# Patient Record
Sex: Male | Born: 1985 | Race: Black or African American | Hispanic: No | Marital: Single | Smoking: Never smoker
Health system: Southern US, Community
[De-identification: ages and names within clinical notes are randomized; demographics above are authoritative.]

---

## 2016-09-13 ENCOUNTER — Ambulatory Visit
Admission: EM | Admit: 2016-09-13 | Discharge: 2016-09-13 | Disposition: A | Discharge status: Home / Self Care | Payer: Self-pay | Attending: Family Medicine | Admitting: Family Medicine

## 2016-09-13 ENCOUNTER — Encounter: Payer: Self-pay | Admitting: *Deleted

## 2016-09-13 ENCOUNTER — Ambulatory Visit (INDEPENDENT_AMBULATORY_CARE_PROVIDER_SITE_OTHER): Payer: Self-pay

## 2016-09-13 DIAGNOSIS — R600 Localized edema: Secondary | ICD-10-CM

## 2016-09-13 DIAGNOSIS — J069 Acute upper respiratory infection, unspecified: Secondary | ICD-10-CM

## 2016-09-13 MED ORDER — HYDROCOD POLST-CPM POLST ER 10-8 MG/5ML PO SUER: 5.0000 mL | Freq: Two times a day (BID) | ORAL | 0 refills | Status: AC

## 2016-09-13 MED ORDER — AZITHROMYCIN 250 MG PO TABS: ORAL_TABLET | ORAL | 0 refills | Status: AC

## 2016-09-13 MED ORDER — BENZONATATE 200 MG PO CAPS: 200.0000 mg | ORAL_CAPSULE | Freq: Three times a day (TID) | ORAL | 0 refills | Status: AC

## 2016-09-13 NOTE — ED Triage Notes (Signed)
Patient has had symptoms of cough, chest congestion, and headache for 3 weeks that will not resolve with OTC medications. Bilateral foot swelling started 10 days ago. Possible mechanism of injury unknown.

## 2016-09-13 NOTE — Discharge Instructions (Signed)
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 Be careful of the amount of salt you eat in food.

## 2016-09-13 NOTE — ED Provider Notes (Signed)
CSN: [id number]     Arrival date & time 09/13/16  1354 History   First MD Initiated Contact with Patient 09/13/16 1624     Chief Complaint  Patient presents with  . Cough  . 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 Been taking over-the-counter medicines without success. He states that his cough is not always productive but he also mentions that his been having night sweats. Is having to change his   night shirt t twice each night. He does have chills but has not had any measurable fever although his temperature today is 99. Also concerned because of foot swelling that he has noticed . He does sit for prolonged periods while driving his truck. Never seeing the foot swelling before. It does not involve his legs only his feet mostly seen around the ankles.He is originally from [place]       History reviewed. No pertinent past medical history. History reviewed. No pertinent surgical history. History reviewed. No pertinent family history. Social History  Substance Use Topics  . Smoking status: Never Smoker  . Smokeless tobacco: Never Used  . Alcohol use No    Review of Systems  Constitutional: Positive for activity change, chills and fatigue. Negative for fever.  HENT: Positive for congestion.   Respiratory: Positive for cough. Negative for shortness of breath, wheezing and stridor.   All other systems reviewed and are negative.   Allergies  Patient has no known allergies.  Home Medications   Prior to Admission medications   Medication Sig Start Date End Date Taking? Authorizing Provider  azithromycin (ZITHROMAX Z-PAK) 250 MG tablet Use as per package instructions 09/13/16   Lutricia Feil, PA-C  benzonatate (TESSALON) 200 MG capsule Take 1 capsule (200 mg total) by mouth every 8  (eight) hours. 09/13/16   Lutricia Feil, PA-C  chlorpheniramine-HYDROcodone (TUSSIONEX PENNKINETIC ER) 10-8 MG/5ML SUER Take 5 mLs by mouth 2 (two) times daily. 09/13/16   Lutricia Feil, PA-C   Meds Ordered and Administered this Visit  Medications - No data to display  BP 136/87 (BP Location: Left Arm)   Pulse 87   Temp 99 F (37.2 C) (Oral)   Resp 18   Ht 5' 9.29" (1.76 m)   Wt 154 lb 5.2 oz (70 kg)   SpO2 98%   BMI 22.60 kg/m  No data found.   Physical Exam  Constitutional: He is oriented to person, place, and time. He appears well-developed and well-nourished. No distress.  HENT:  Head: Normocephalic and atraumatic.  Right Ear: External ear normal.  Left Ear: External ear normal.  Nose: Nose normal.  Mouth/Throat: Oropharynx is clear and moist. No oropharyngeal exudate.  Eyes: Pupils are equal, round, and reactive to light. Right eye exhibits no discharge. Left eye exhibits no discharge.  Neck: Normal range of motion. Neck supple.  Pulmonary/Chest: Effort normal.  Patient has fine non-tussive crackles in the right upper lobe.  Musculoskeletal: Normal range of motion.  Neurological: He is alert and oriented to person, place, and time.  Skin: Skin is warm and dry. He is not diaphoretic.  Psychiatric: He has a normal mood and affect. His behavior is normal. Judgment and thought content normal.  Nursing note and vitals reviewed.   Urgent Care Course     Procedures (including critical care time)  Labs Review Labs Reviewed -  No data to display  Imaging Review Dg Chest 2 View  Result Date: 09/13/2016 CLINICAL DATA:  Cough, chest congestion, and headache for 3 weeks unresponsive to over the counter medication. Onset of bilateral lower extremity edema 10 days ago. EXAM: CHEST  2 VIEW COMPARISON:  None in PACs FINDINGS: The lungs are well-expanded and clear. The heart and pulmonary vascularity are normal. The mediastinum is normal in width. There is no pleural effusion.  The trachea is midline. The bony thorax exhibits no acute abnormality. IMPRESSION: There is no pneumonia, CHF, nor other acute cardiopulmonary abnormality. Electronically Signed   By: David  [place] M.D.   On: 09/13/2016 16:59     Visual Acuity Review  Right Eye Distance:   Left Eye Distance:   Bilateral Distance:    Right Eye Near:   Left Eye Near:    Bilateral Near:         MDM   1. Upper respiratory tract infection, unspecified type   2. Pedal edema    Discharge Medication List as of 09/13/2016  5:29 PM    START taking these medications   Details  azithromycin (ZITHROMAX Z-PAK) 250 MG tablet Use as per package instructions, Normal    benzonatate (TESSALON) 200 MG capsule Take 1 capsule (200 mg total) by mouth every 8 (eight) hours., Starting Tue 09/13/2016, Normal    chlorpheniramine-HYDROcodone (TUSSIONEX PENNKINETIC ER) 10-8 MG/5ML SUER Take 5 mLs by mouth 2 (two) times daily., Starting Tue 09/13/2016, Print      Plan: 1. Test/x-ray results and diagnosis reviewed with patient 2. rx as per orders; risks, benefits, potential side effects reviewed with patient 3. Recommend supportive treatment with rest and fluids for the cough. Had this for over 3 weeks I will start him on an antibiotic although the chest x-ray did not show any cardiopulmonary disease at the present time. I have provided him with a Tessalon pearls for use while driving and Tussionex only when he is not driving and have given him this warning. He should not use the Tussionex with activities requiring concentration and judgment and certainly not  driving. For his pedal edema which I believe is dependent he should use compression hose driving and elevate his feet every night above the level of his heart. 4. F/u prn if symptoms worsen or don't improve     Lutricia Feil, PA-C 09/13/16 1735    Lutricia Feil, PA-C 09/13/16 [phone]

## 2018-02-10 IMAGING — CR DG CHEST 2V
2 series · 2 of 2 positions shown · non-contrast
Comparison: None in PACs

CLINICAL DATA: Cough, chest congestion, and headache for 3 weeks
unresponsive to over the counter medication. Onset of bilateral
lower extremity edema 10 days ago.

EXAM:
CHEST  2 VIEW

[chest pa]
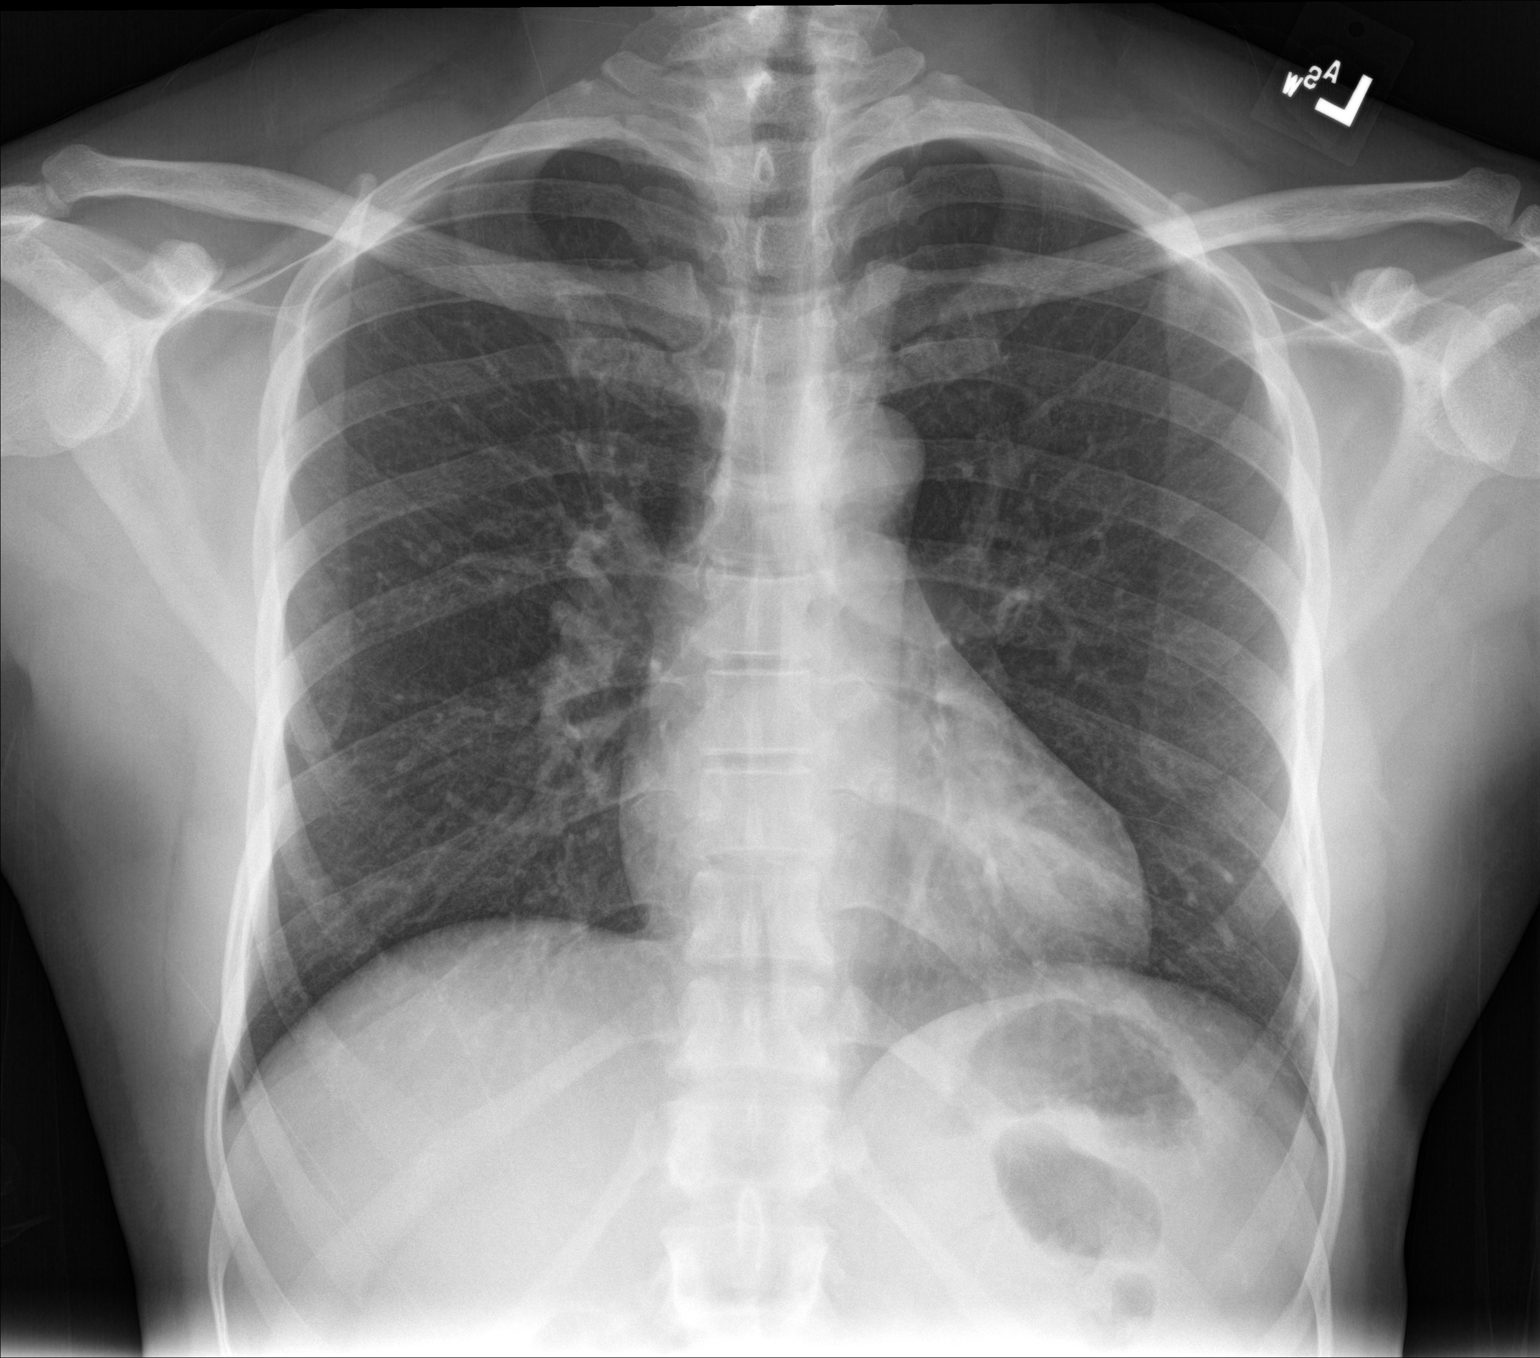

[chest lat]
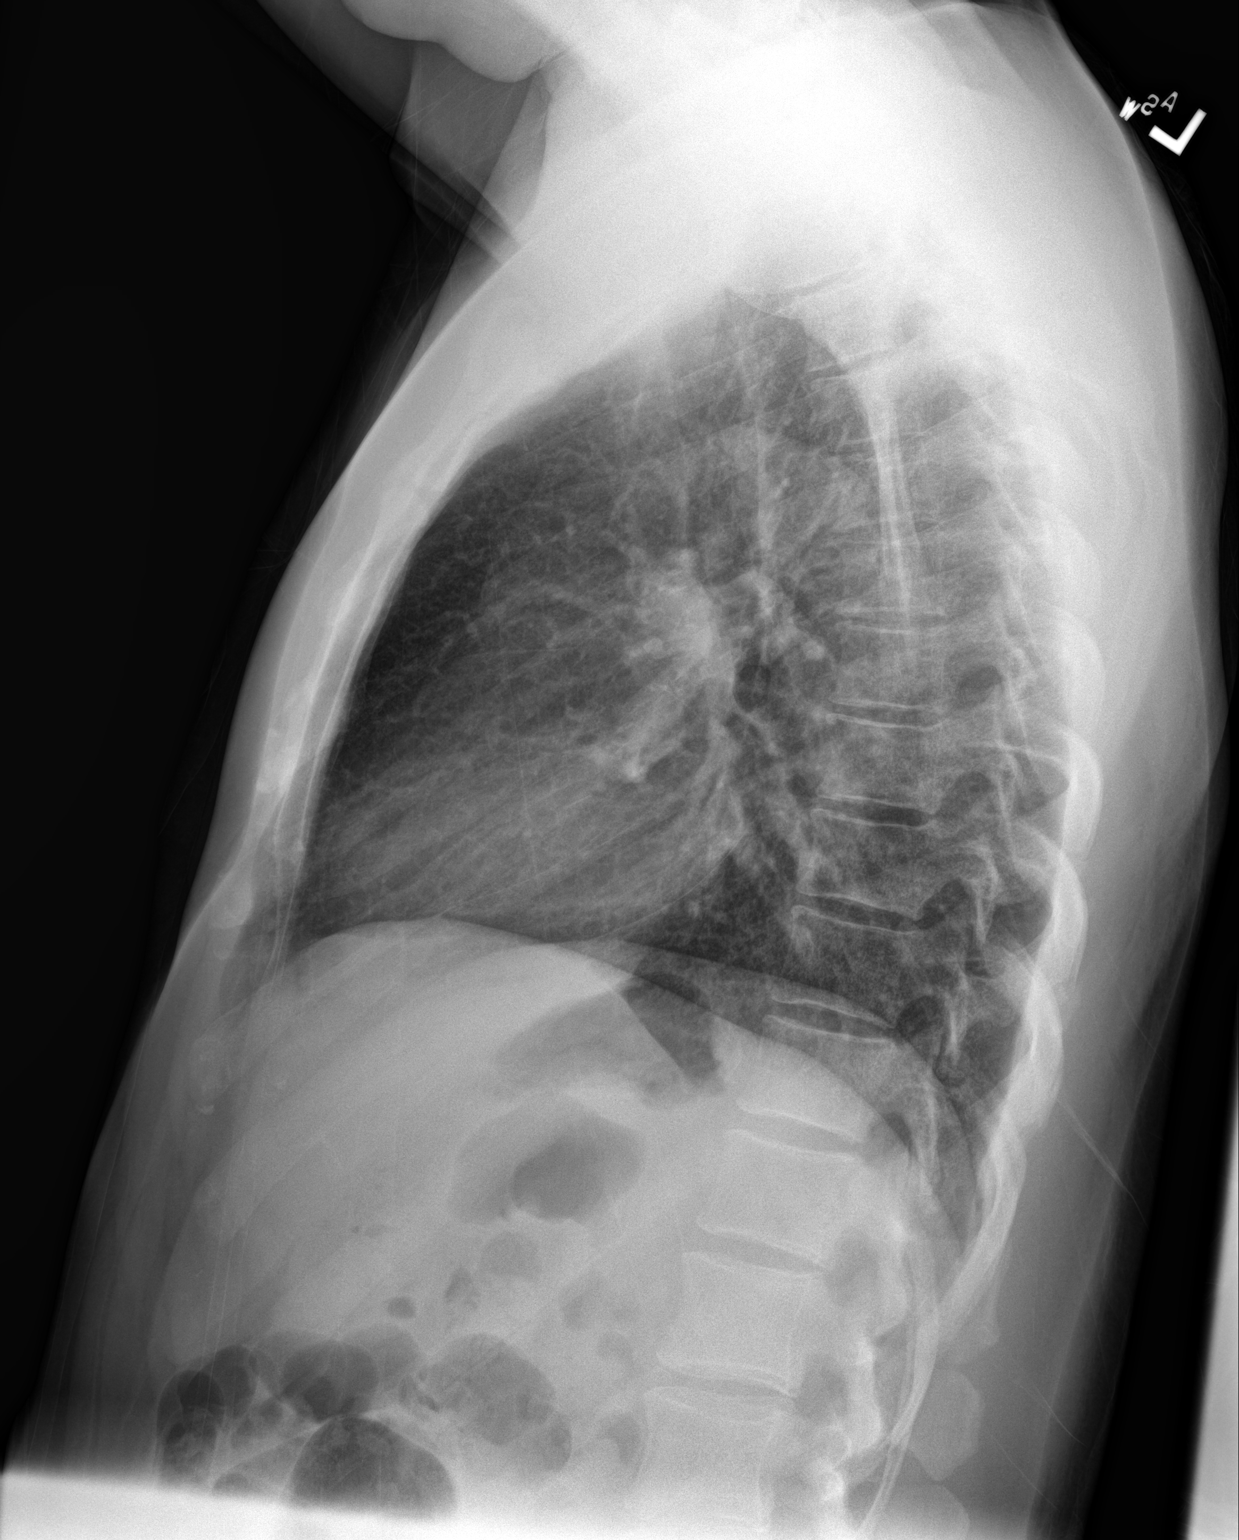

[2 of 2 positions shown; findings below may reference images not displayed]

FINDINGS: The lungs are well-expanded and clear. The heart and pulmonary
vascularity are normal. The mediastinum is normal in width. There is
no pleural effusion. The trachea is midline. The bony thorax
exhibits no acute abnormality.
IMPRESSION: There is no pneumonia, CHF, nor other acute cardiopulmonary
abnormality.
